# Patient Record
Sex: Male | Born: 1985 | Race: White | Hispanic: No | Marital: Single | Smoking: Never smoker
Health system: Southern US, Community
[De-identification: ages and names within clinical notes are randomized; demographics above are authoritative.]

## PROBLEM LIST (undated history)

## (undated) HISTORY — PX: OTHER SURGICAL HISTORY: SHX169

---

## 2020-12-08 ENCOUNTER — Encounter: Payer: Self-pay | Admitting: Family Medicine

## 2020-12-08 ENCOUNTER — Ambulatory Visit (INDEPENDENT_AMBULATORY_CARE_PROVIDER_SITE_OTHER): Payer: 59

## 2020-12-08 ENCOUNTER — Other Ambulatory Visit: Payer: Self-pay

## 2020-12-08 ENCOUNTER — Ambulatory Visit: Payer: 59 | Admitting: Family Medicine

## 2020-12-08 VITALS — BP 125/79 | HR 87 | Temp 98.2°F | Ht 70.0 in | Wt 229.4 lb

## 2020-12-08 DIAGNOSIS — Z23 Encounter for immunization: Secondary | ICD-10-CM

## 2020-12-08 DIAGNOSIS — R0789 Other chest pain: Secondary | ICD-10-CM

## 2020-12-08 MED ORDER — BETAMETHASONE SOD PHOS & ACET 6 (3-3) MG/ML IJ SUSP
6.0000 mg | Freq: Once | INTRAMUSCULAR | Status: AC
  Administered 2020-12-08: 6 mg via INTRAMUSCULAR

## 2020-12-08 NOTE — Progress Notes (Signed)
Subjective:  Patient ID: Theodore Richardson, male    DOB: 27-Aug-1985  Age: 35 y.o. MRN: [id number]  CC: Establish Care    HPI Delois Tolbert presents for new patient visit. 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  Graduated from [place]. Went to [employer] then got associates at [employer].  Chest -  fever chills with  a cold in March. For a coiple of days. The next week developed fever, chills and myalgia. Felt like there was something in his lungs he couldn't get out. Keeping him up at night. Discomfort. No dyspnea. Took an antibiotic. That helped. Still there though. Finished it a month ago.   Depression screen PHQ 2/9 12/08/2020  Decreased Interest 0  Down, Depressed, Hopeless 0  PHQ - 2 Score 0    History Johm has no past medical history on file.   He has a past surgical history that includes BONE TUMOR REMOVAL (Right).   His family history includes Paget's disease of bone in his mother; Stroke in his father.He reports that he has never smoked. He has never used smokeless tobacco. He reports previous alcohol use. He reports previous drug use.    ROS Review of Systems  Constitutional: Negative for fever.  HENT: Positive for trouble swallowing. Negative for congestion.   Respiratory: Negative for cough and shortness of breath.   Cardiovascular: Negative for chest pain.  Musculoskeletal: Negative for arthralgias.  Skin: Negative for rash.    Objective:  BP 125/79   Pulse 87   Temp 98.2 F (36.8 C)   Ht 5\' 10"  (1.778 m)   Wt 229 lb 6.4 oz (104.1 kg)   SpO2 96%   BMI 32.92 kg/m   BP Readings from Last 3 Encounters:  12/08/20 125/79    Wt Readings from Last 3 Encounters:  12/08/20 229 lb 6.4 oz (104.1 kg)     Physical Exam Vitals reviewed.  Constitutional:      Appearance: He is well-developed.  HENT:     Head: Normocephalic and atraumatic.     Right Ear: Tympanic membrane and external ear normal. No decreased  hearing noted.     Left Ear: Tympanic membrane and external ear normal. No decreased hearing noted.     Mouth/Throat:     Pharynx: No oropharyngeal exudate or posterior oropharyngeal erythema.  Eyes:     Pupils: Pupils are equal, round, and reactive to light.  Cardiovascular:     Rate and Rhythm: Normal rate and regular rhythm.     Heart sounds: No murmur heard.   Pulmonary:     Effort: No respiratory distress.     Breath sounds: Normal breath sounds.  Abdominal:     General: Bowel sounds are normal.     Palpations: Abdomen is soft. There is no mass.     Tenderness: There is no abdominal tenderness.  Musculoskeletal:     Cervical back: Normal range of motion and neck supple.      CXR - normal  Assessment & Plan:   Devansh was seen today for establish care.  Diagnoses and all orders for this visit:  Other chest pain -     DG Chest 2 View; Future -     betamethasone acetate-betamethasone sodium phosphate (CELESTONE) injection 6 mg  Need for Tdap vaccination -     Tdap vaccine greater than or equal to 7yo IM       We administered betamethasone acetate-betamethasone sodium phosphate.  Allergies as of  12/08/2020   No Known Allergies     Medication List    as of Dec 08, 2020 11:59 PM   You have not been prescribed any medications.      Follow-up: No follow-ups on file.  Mechele Claude, M.D.

## 2020-12-11 ENCOUNTER — Encounter: Payer: Self-pay | Admitting: Family Medicine

## 2020-12-12 NOTE — Progress Notes (Signed)
Your chest x-ray looked normal. Thanks, WS.

## 2022-01-29 ENCOUNTER — Ambulatory Visit: Payer: 59 | Admitting: Family Medicine

## 2022-01-29 ENCOUNTER — Encounter: Payer: Self-pay | Admitting: Family Medicine

## 2022-01-29 VITALS — BP 117/76 | HR 86 | Temp 97.6°F | Ht 70.0 in | Wt 239.0 lb

## 2022-01-29 DIAGNOSIS — J329 Chronic sinusitis, unspecified: Secondary | ICD-10-CM

## 2022-01-29 DIAGNOSIS — R1314 Dysphagia, pharyngoesophageal phase: Secondary | ICD-10-CM | POA: Diagnosis not present

## 2022-01-29 MED ORDER — PSEUDOEPHEDRINE-GUAIFENESIN ER 120-1200 MG PO TB12: 1.0000 | ORAL_TABLET | Freq: Two times a day (BID) | ORAL | 1 refills | Status: DC

## 2022-01-29 NOTE — Progress Notes (Addendum)
Chief Complaint  Patient presents with   throat problem    HPI  Patient presents today for coughed up sticky substance. Thick, white. Feels it in throat and swallows it. Onset 8 years ago.  Durrently has tickle in the throat that has been there a month. Had an episode last year. No dyspnea or dysphagia. Constant annoyance. Denies sore throat. No fever. No dyspnea.Handles food well, but has some reflux- espceially after drinking soda.  PMH: Smoking status noted Review of Systems  Constitutional:  Negative for fever.  HENT:  Positive for sneezing (occasional). Negative for sore throat.   Respiratory:  Positive for cough. Negative for shortness of breath and wheezing.   Cardiovascular:  Negative for chest pain.  Musculoskeletal:  Negative for arthralgias.  Skin:  Negative for rash.     Objective: BP 117/76   Pulse 86   Temp 97.6 F (36.4 C)   Ht 5\' 10"  (1.778 m)   Wt 239 lb (108.4 kg)   SpO2 94%   BMI 34.29 kg/m  Gen: NAD, alert, cooperative with exam HEENT: NCAT, EOMI, PERRL CV: RRR, good S1/S2, no murmur Resp: CTABL, no wheezes, non-labored Abd: SNTND, BS present, no guarding or organomegaly Ext: No edema, warm Neuro: Alert and oriented, No gross deficits  Assessment and plan:  1. Chronic sinusitis, unspecified location   2. Pharyngoesophageal dysphagia     Meds ordered this encounter  Medications   Pseudoephedrine-Guaifenesin [phone] MG TB12    Sig: Take 1 tablet by mouth 2 (two) times daily. For congestion    Dispense:  60 tablet    Refill:  1    Orders Placed This Encounter  Procedures   DG ESOPHAGUS W SINGLE CM (SOL OR THIN BA)    Standing Status:   Future    Standing Expiration Date:   01/30/2023    Order Specific Question:   Reason for Exam (SYMPTOM  OR DIAGNOSIS REQUIRED)    Answer:   dysphagia, cough    Order Specific Question:   Preferred imaging location?    Answer:   [facility]    Follow up as needed.  Mechele Claude, MD

## 2022-03-14 ENCOUNTER — Ambulatory Visit (INDEPENDENT_AMBULATORY_CARE_PROVIDER_SITE_OTHER): Payer: 59

## 2022-03-14 ENCOUNTER — Encounter: Payer: Self-pay | Admitting: Family Medicine

## 2022-03-14 ENCOUNTER — Ambulatory Visit: Payer: 59 | Admitting: Family Medicine

## 2022-03-14 VITALS — BP 120/79 | HR 56 | Temp 98.0°F | Ht 70.0 in | Wt 242.4 lb

## 2022-03-14 DIAGNOSIS — J329 Chronic sinusitis, unspecified: Secondary | ICD-10-CM | POA: Diagnosis not present

## 2022-03-14 DIAGNOSIS — M25551 Pain in right hip: Secondary | ICD-10-CM

## 2022-03-14 MED ORDER — FEXOFENADINE-PSEUDOEPHED ER 180-240 MG PO TB24: 1.0000 | ORAL_TABLET | Freq: Every evening | ORAL | 11 refills | Status: DC

## 2022-03-14 NOTE — Progress Notes (Signed)
Subjective:  Patient ID: Theodore Richardson, male    DOB: 1986-05-18  Age: 36 y.o. MRN: [id number]  CC: Follow-up and Sinusitis   HPI Theodore Richardson presents for DECREASED PHLEGM, BUT STILL SOME THERE. Still coughing up some. Sticky, clear multiple times a day. Feels a lot better. Would like better relief. Cough is frequent, deep   Right hip pops and hurts. INtrefering with sleep.     03/14/2022   10:20 AM 03/14/2022   10:15 AM 01/29/2022    9:58 AM  Depression screen PHQ 2/9  Decreased Interest 0 0 0  Down, Depressed, Hopeless 0 0 0  PHQ - 2 Score 0 0 0  Altered sleeping 2  2  Tired, decreased energy 1  1  Change in appetite 0  0  Feeling bad or failure about yourself  0  0  Trouble concentrating 0  0  Moving slowly or fidgety/restless 0  0  Suicidal thoughts 0  0  PHQ-9 Score 3  3  Difficult doing work/chores Somewhat difficult  Not difficult at all    History Theodore Richardson has no past medical history on file.   He has a past surgical history that includes BONE TUMOR REMOVAL (Right).   His family history includes Paget's disease of bone in his mother; Stroke in his father.He reports that he has never smoked. He has never used smokeless tobacco. He reports that he does not currently use alcohol. He reports that he does not currently use drugs.    ROS Review of Systems  Constitutional:  Negative for fever.  Respiratory:  Positive for cough. Negative for shortness of breath.   Cardiovascular:  Negative for chest pain.  Musculoskeletal:  Negative for arthralgias.  Skin:  Negative for rash.    Objective:  BP 120/79   Pulse (!) 56   Temp 98 F (36.7 C)   Ht 5\' 10"  (1.778 m)   Wt 242 lb 6.4 oz (110 kg)   SpO2 96%   BMI 34.78 kg/m   BP Readings from Last 3 Encounters:  03/14/22 120/79  01/29/22 117/76  12/08/20 125/79    Wt Readings from Last 3 Encounters:  03/14/22 242 lb 6.4 oz (110 kg)  01/29/22 239 lb (108.4 kg)  12/08/20 229 lb 6.4 oz (104.1 kg)      Physical Exam Vitals reviewed.  Constitutional:      Appearance: He is well-developed.  HENT:     Head: Normocephalic and atraumatic.     Right Ear: External ear normal.     Left Ear: External ear normal.     Nose: Congestion present.     Mouth/Throat:     Mouth: Mucous membranes are moist.     Pharynx: Oropharynx is clear. No oropharyngeal exudate or posterior oropharyngeal erythema.  Eyes:     Pupils: Pupils are equal, round, and reactive to light.  Cardiovascular:     Rate and Rhythm: Normal rate and regular rhythm.     Heart sounds: No murmur heard. Pulmonary:     Effort: No respiratory distress.     Breath sounds: Normal breath sounds.  Musculoskeletal:        General: No tenderness or deformity. Normal range of motion.     Cervical back: Normal range of motion and neck supple.  Neurological:     Mental Status: He is alert and oriented to person, place, and time.       Assessment & Plan:   Theodore Richardson was seen today for follow-up and sinusitis.  Diagnoses and all orders for this visit:  Chronic sinusitis, unspecified location -     Ambulatory referral to ENT  Pain of right hip -     DG HIP UNILAT W OR W/O PELVIS 2-3 VIEWS RIGHT; Future  Other orders -     fexofenadine-pseudoephedrine (ALLEGRA-D 24) 180-240 MG 24 hr tablet; Take 1 tablet by mouth every evening. For allergy and congestion       I have discontinued [place] Pseudoephedrine-Guaifenesin. I am also having him start on fexofenadine-pseudoephedrine.  Allergies as of 03/14/2022   No Known Allergies      Medication List        Accurate as of March 14, 2022  6:14 PM. If you have any questions, ask your nurse or doctor.          STOP taking these medications    Pseudoephedrine-Guaifenesin [phone] MG Tb12 Stopped by: Mechele Claude, MD       TAKE these medications    fexofenadine-pseudoephedrine 180-240 MG 24 hr tablet Commonly known as: ALLEGRA-D 24 Take 1 tablet  by mouth every evening. For allergy and congestion Started by: Mechele Claude, MD         Follow-up: Return if symptoms worsen or fail to improve.  Mechele Claude, M.D.

## 2022-04-28 IMAGING — DX DG CHEST 2V
2 series · 2 of 2 positions shown · non-contrast
Comparison: None.

CLINICAL DATA: 34-year-old male with a history of chest pain

EXAM:
CHEST - 2 VIEW

[chest pa]
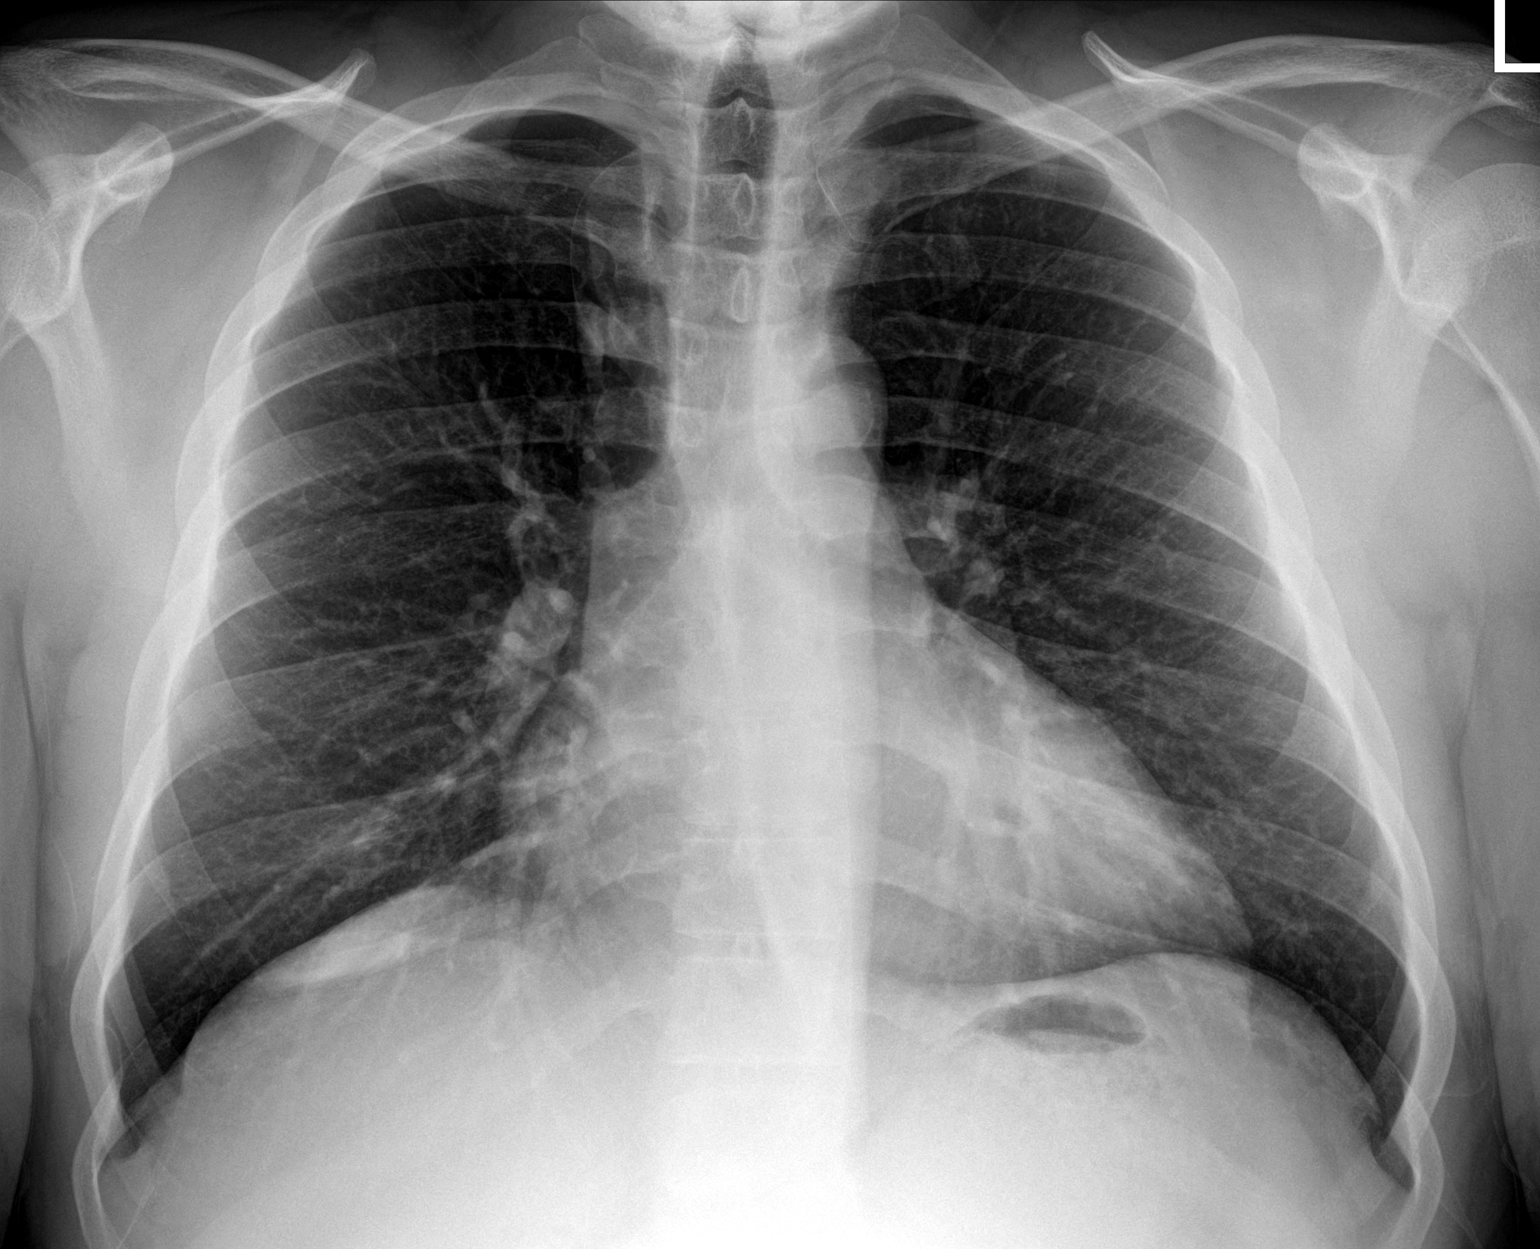

[chest lat]
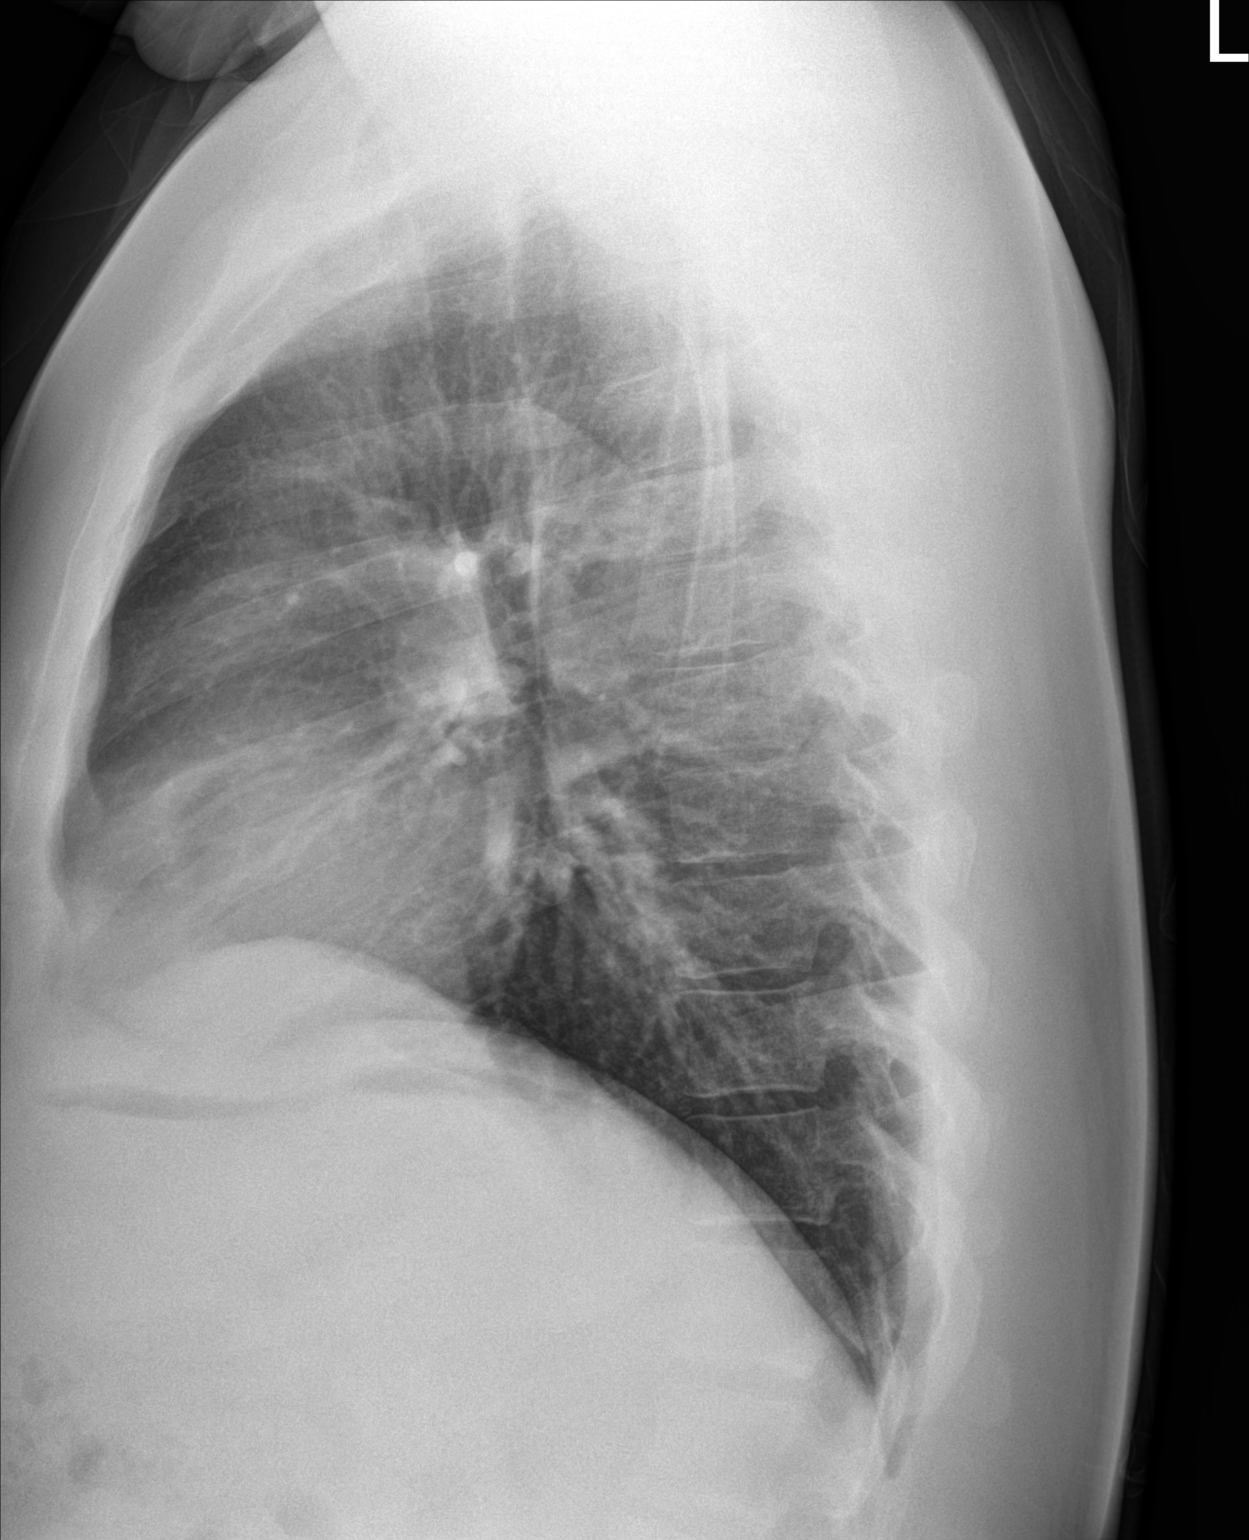

[2 of 2 positions shown; findings below may reference images not displayed]

FINDINGS: The heart size and mediastinal contours are within normal limits.
Azygos lobe. Both lungs are clear. The visualized skeletal
structures are unremarkable.
IMPRESSION: No active cardiopulmonary disease.

## 2023-01-08 ENCOUNTER — Encounter: Payer: Self-pay | Admitting: Family Medicine

## 2023-01-08 ENCOUNTER — Ambulatory Visit (INDEPENDENT_AMBULATORY_CARE_PROVIDER_SITE_OTHER): Payer: 59 | Admitting: Family Medicine

## 2023-01-08 ENCOUNTER — Ambulatory Visit (INDEPENDENT_AMBULATORY_CARE_PROVIDER_SITE_OTHER): Payer: 59

## 2023-01-08 VITALS — BP 133/84 | HR 95 | Temp 97.6°F | Ht 70.0 in | Wt 261.4 lb

## 2023-01-08 DIAGNOSIS — R1012 Left upper quadrant pain: Secondary | ICD-10-CM

## 2023-01-08 MED ORDER — PANTOPRAZOLE SODIUM 40 MG PO TBEC: 40.0000 mg | DELAYED_RELEASE_TABLET | Freq: Every day | ORAL | 11 refills | Status: DC

## 2023-01-08 NOTE — Progress Notes (Signed)
`  Subjective:  Patient ID: Theodore Richardson, male    DOB: April 06, 1986  Age: 37 y.o. MRN: [id number]  CC: GI Problem   HPI Theodore Richardson presents for sharp LUQ pain onset a few months ago. Like a pulled muscle. Recurring qod lasting 2 hours. Can be dull or sharp. Slight twinge at present. Not affected by foods. No change of appetite. Gets acid reflux and heartburn frequently. Takes omeprazole prn. No NVDC. Denies rectal bleeding, melena.     01/08/2023    3:51 PM 03/14/2022   10:20 AM 03/14/2022   10:15 AM  Depression screen PHQ 2/9  Decreased Interest 0 0 0  Down, Depressed, Hopeless 0 0 0  PHQ - 2 Score 0 0 0  Altered sleeping  2   Tired, decreased energy  1   Change in appetite  0   Feeling bad or failure about yourself   0   Trouble concentrating  0   Moving slowly or fidgety/restless  0   Suicidal thoughts  0   PHQ-9 Score  3   Difficult doing work/chores  Somewhat difficult     History Theodore Richardson has no past medical history on file.   He has a past surgical history that includes BONE TUMOR REMOVAL (Right).   His family history includes Paget's disease of bone in his mother; Stroke in his father.He reports that he has never smoked. He has never used smokeless tobacco. He reports that he does not currently use alcohol. He reports that he does not currently use drugs.    ROS Review of Systems  Constitutional:  Negative for chills, diaphoresis, fever and unexpected weight change.  HENT:  Negative for rhinorrhea and trouble swallowing.   Respiratory:  Negative for cough, chest tightness and shortness of breath.   Cardiovascular:  Negative for chest pain.  Gastrointestinal:  Positive for abdominal pain. Negative for abdominal distention, blood in stool, constipation, diarrhea, nausea, rectal pain and vomiting.  Genitourinary:  Negative for dysuria, flank pain and hematuria.  Musculoskeletal:  Negative for arthralgias and joint swelling.  Skin:  Negative for rash.   Neurological:  Negative for syncope and headaches.    Objective:  BP 133/84   Pulse 95   Temp 97.6 F (36.4 C)   Ht 5\' 10"  (1.778 m)   Wt 261 lb 6.4 oz (118.6 kg)   SpO2 97%   BMI 37.51 kg/m   BP Readings from Last 3 Encounters:  01/08/23 133/84  03/14/22 120/79  01/29/22 117/76    Wt Readings from Last 3 Encounters:  01/08/23 261 lb 6.4 oz (118.6 kg)  03/14/22 242 lb 6.4 oz (110 kg)  01/29/22 239 lb (108.4 kg)     Physical Exam Constitutional:      General: He is not in acute distress.    Appearance: He is well-developed. He is obese.  HENT:     Head: Normocephalic and atraumatic.     Right Ear: External ear normal.     Left Ear: External ear normal.     Nose: Nose normal.  Eyes:     Conjunctiva/sclera: Conjunctivae normal.     Pupils: Pupils are equal, round, and reactive to light.  Cardiovascular:     Rate and Rhythm: Normal rate and regular rhythm.     Heart sounds: Normal heart sounds. No murmur heard. Pulmonary:     Effort: Pulmonary effort is normal. No respiratory distress.     Breath sounds: Normal breath sounds. No wheezing or rales.  Abdominal:  General: Bowel sounds are normal.     Palpations: Abdomen is soft. There is no mass.     Tenderness: There is no abdominal tenderness. There is no guarding or rebound.     Hernia: No hernia is present.  Musculoskeletal:        General: Normal range of motion.     Cervical back: Normal range of motion and neck supple.  Skin:    General: Skin is warm and dry.  Neurological:     Mental Status: He is alert and oriented to person, place, and time.     Deep Tendon Reflexes: Reflexes are normal and symmetric.  Psychiatric:        Behavior: Behavior normal.        Thought Content: Thought content normal.        Judgment: Judgment normal.       Assessment & Plan:   Theodore Richardson was seen today for gi problem.  Diagnoses and all orders for this visit:  LUQ abdominal pain -     DG Abd 2 Views;  Future  Other orders -     pantoprazole (PROTONIX) 40 MG tablet; Take 1 tablet (40 mg total) by mouth daily. For stomach       I am having Theodore Richardson start on pantoprazole. I am also having him maintain his fexofenadine-pseudoephedrine.  Allergies as of 01/08/2023   No Known Allergies      Medication List        Accurate as of January 08, 2023  5:13 PM. If you have any questions, ask your nurse or doctor.          fexofenadine-pseudoephedrine 180-240 MG 24 hr tablet Commonly known as: ALLEGRA-D 24 Take 1 tablet by mouth every evening. For allergy and congestion   pantoprazole 40 MG tablet Commonly known as: PROTONIX Take 1 tablet (40 mg total) by mouth daily. For stomach Started by: Mechele Claude, MD         Follow-up: Return in about 1 month (around 02/07/2023).  Mechele Claude, M.D.

## 2024-03-03 ENCOUNTER — Other Ambulatory Visit: Payer: Self-pay | Admitting: Family Medicine

## 2024-03-03 NOTE — Telephone Encounter (Signed)
 Appt scheduled for 03/23/2024

## 2024-03-03 NOTE — Telephone Encounter (Signed)
 Stacks NTBS last OV 01/08/23 NO RF sent to pharmacy last OV greater than a year

## 2024-03-23 ENCOUNTER — Encounter: Payer: Self-pay | Admitting: Family Medicine

## 2024-03-23 ENCOUNTER — Ambulatory Visit (INDEPENDENT_AMBULATORY_CARE_PROVIDER_SITE_OTHER): Admitting: Family Medicine

## 2024-03-23 VITALS — BP 113/77 | HR 81 | Temp 97.8°F | Ht 70.0 in | Wt 233.8 lb

## 2024-03-23 DIAGNOSIS — K2100 Gastro-esophageal reflux disease with esophagitis, without bleeding: Secondary | ICD-10-CM | POA: Diagnosis not present

## 2024-03-23 MED ORDER — PANTOPRAZOLE SODIUM 40 MG PO TBEC: 40.0000 mg | DELAYED_RELEASE_TABLET | Freq: Every day | ORAL | 11 refills | Status: DC

## 2024-03-23 MED ORDER — FAMOTIDINE 10 MG PO TABS: 10.0000 mg | ORAL_TABLET | Freq: Two times a day (BID) | ORAL | 1 refills | Status: AC | PRN

## 2024-03-23 NOTE — Progress Notes (Signed)
 Subjective:  Patient ID: Theodore Richardson, male    DOB: Dec 15, 1985  Age: 38 y.o. MRN: [id number]  CC: Medication Refill   HPI  History of Present Illness   Patient in for follow-up of GERD. Currently asymptomatic taking  PPI prn, not very often. There is no chest pain or heartburn. No hematemesis and no melena. No dysphagia or choking. Onset is remote. Progression is stable. Complicating factors, none.        03/23/2024    1:28 PM 01/08/2023    3:51 PM 03/14/2022   10:20 AM  Depression screen PHQ 2/9  Decreased Interest 0 0 0  Down, Depressed, Hopeless 0 0 0  PHQ - 2 Score 0 0 0  Altered sleeping   2  Tired, decreased energy   1  Change in appetite   0  Feeling bad or failure about yourself    0  Trouble concentrating   0  Moving slowly or fidgety/restless   0  Suicidal thoughts   0  PHQ-9 Score   3  Difficult doing work/chores   Somewhat difficult    History Theodore Richardson has no past medical history on file.   He has a past surgical history that includes BONE TUMOR REMOVAL (Right).   His family history includes Paget's disease of bone in his mother; Stroke in his father.He reports that he has never smoked. He has never used smokeless tobacco. He reports that he does not currently use alcohol. He reports that he does not currently use drugs.    ROS Review of Systems  Constitutional:  Negative for fever.  Respiratory:  Negative for shortness of breath.   Cardiovascular:  Negative for chest pain.  Musculoskeletal:  Negative for arthralgias.  Skin:  Negative for rash.    Objective:  BP 113/77   Pulse 81   Temp 97.8 F (36.6 C)   Ht 5' 10 (1.778 m)   Wt 233 lb 12.8 oz (106.1 kg)   SpO2 95%   BMI 33.55 kg/m   BP Readings from Last 3 Encounters:  03/23/24 113/77  01/08/23 133/84  03/14/22 120/79    Wt Readings from Last 3 Encounters:  03/23/24 233 lb 12.8 oz (106.1 kg)  01/08/23 261 lb 6.4 oz (118.6 kg)  03/14/22 242 lb 6.4 oz (110 kg)     Physical  Exam Vitals reviewed.  Constitutional:      Appearance: He is well-developed.  HENT:     Head: Normocephalic and atraumatic.     Right Ear: External ear normal.     Left Ear: External ear normal.     Mouth/Throat:     Pharynx: No oropharyngeal exudate or posterior oropharyngeal erythema.  Eyes:     Pupils: Pupils are equal, round, and reactive to light.  Cardiovascular:     Rate and Rhythm: Normal rate and regular rhythm.     Heart sounds: No murmur heard. Pulmonary:     Effort: No respiratory distress.     Breath sounds: Normal breath sounds.  Abdominal:     General: There is no distension.     Palpations: Abdomen is soft.     Tenderness: There is no abdominal tenderness.  Musculoskeletal:     Cervical back: Normal range of motion and neck supple.  Neurological:     Mental Status: He is alert and oriented to person, place, and time.      Assessment & Plan:  Gastroesophageal reflux disease with esophagitis without hemorrhage -     Famotidine ;  Take 1 tablet (10 mg total) by mouth 2 (two) times daily as needed for heartburn or indigestion.  Dispense: 90 tablet; Refill: 1     Assessment & Plan       Follow-up: Consider CPE soon  Butler Der, M.D.

## 2024-06-19 ENCOUNTER — Other Ambulatory Visit: Payer: Self-pay | Admitting: Medical Genetics

## 2024-06-23 ENCOUNTER — Encounter: Payer: Self-pay | Admitting: Family Medicine
